# Patient Record
Sex: Female | Born: 1950 | Hispanic: No | Smoking: Never smoker
Health system: Southern US, Community
[De-identification: ages and names within clinical notes are randomized; demographics above are authoritative.]

## PROBLEM LIST (undated history)

## (undated) DIAGNOSIS — T7840XA Allergy, unspecified, initial encounter: Secondary | ICD-10-CM

## (undated) DIAGNOSIS — F419 Anxiety disorder, unspecified: Secondary | ICD-10-CM

## (undated) DIAGNOSIS — D649 Anemia, unspecified: Secondary | ICD-10-CM

## (undated) DIAGNOSIS — N809 Endometriosis, unspecified: Secondary | ICD-10-CM

## (undated) HISTORY — DX: Anemia, unspecified: D64.9

## (undated) HISTORY — PX: LAPAROSCOPIC ABDOMINAL EXPLORATION: SHX6249

## (undated) HISTORY — PX: WISDOM TOOTH EXTRACTION: SHX21

## (undated) HISTORY — PX: TUBAL LIGATION: SHX77

## (undated) HISTORY — DX: Anxiety disorder, unspecified: F41.9

## (undated) HISTORY — DX: Allergy, unspecified, initial encounter: T78.40XA

---

## 2015-07-24 ENCOUNTER — Emergency Department (HOSPITAL_COMMUNITY)
Admission: EM | Admit: 2015-07-24 | Discharge: 2015-07-24 | Disposition: A | Discharge status: Home / Self Care | Payer: No Typology Code available for payment source | Attending: Emergency Medicine | Admitting: Emergency Medicine

## 2015-07-24 ENCOUNTER — Encounter (HOSPITAL_COMMUNITY): Payer: Self-pay | Admitting: *Deleted

## 2015-07-24 ENCOUNTER — Emergency Department (HOSPITAL_COMMUNITY): Payer: No Typology Code available for payment source

## 2015-07-24 DIAGNOSIS — S138XXA Sprain of joints and ligaments of other parts of neck, initial encounter: Secondary | ICD-10-CM | POA: Diagnosis not present

## 2015-07-24 DIAGNOSIS — S134XXA Sprain of ligaments of cervical spine, initial encounter: Secondary | ICD-10-CM | POA: Diagnosis not present

## 2015-07-24 DIAGNOSIS — Y998 Other external cause status: Secondary | ICD-10-CM | POA: Diagnosis not present

## 2015-07-24 DIAGNOSIS — S139XXA Sprain of joints and ligaments of unspecified parts of neck, initial encounter: Secondary | ICD-10-CM

## 2015-07-24 DIAGNOSIS — S3993XA Unspecified injury of pelvis, initial encounter: Secondary | ICD-10-CM | POA: Diagnosis not present

## 2015-07-24 DIAGNOSIS — S301XXA Contusion of abdominal wall, initial encounter: Secondary | ICD-10-CM | POA: Insufficient documentation

## 2015-07-24 DIAGNOSIS — Y9389 Activity, other specified: Secondary | ICD-10-CM | POA: Insufficient documentation

## 2015-07-24 DIAGNOSIS — R072 Precordial pain: Secondary | ICD-10-CM | POA: Diagnosis not present

## 2015-07-24 DIAGNOSIS — S20211A Contusion of right front wall of thorax, initial encounter: Secondary | ICD-10-CM | POA: Insufficient documentation

## 2015-07-24 DIAGNOSIS — Y92410 Unspecified street and highway as the place of occurrence of the external cause: Secondary | ICD-10-CM | POA: Diagnosis not present

## 2015-07-24 DIAGNOSIS — M542 Cervicalgia: Secondary | ICD-10-CM | POA: Diagnosis not present

## 2015-07-24 DIAGNOSIS — R079 Chest pain, unspecified: Secondary | ICD-10-CM | POA: Diagnosis not present

## 2015-07-24 DIAGNOSIS — T148 Other injury of unspecified body region: Secondary | ICD-10-CM | POA: Diagnosis not present

## 2015-07-24 DIAGNOSIS — M545 Low back pain: Secondary | ICD-10-CM | POA: Diagnosis not present

## 2015-07-24 DIAGNOSIS — S299XXA Unspecified injury of thorax, initial encounter: Secondary | ICD-10-CM | POA: Diagnosis present

## 2015-07-24 MED ORDER — NAPROXEN 500 MG PO TABS: 500.0000 mg | ORAL_TABLET | Freq: Two times a day (BID) | ORAL | Status: DC

## 2015-07-24 MED ORDER — METHOCARBAMOL 500 MG PO TABS: 500.0000 mg | ORAL_TABLET | Freq: Two times a day (BID) | ORAL | Status: DC

## 2015-07-24 MED ORDER — HYDROCODONE-ACETAMINOPHEN 5-325 MG PO TABS: 1.0000 | ORAL_TABLET | Freq: Once | ORAL | Status: DC

## 2015-07-24 MED ORDER — HYDROCODONE-ACETAMINOPHEN 5-325 MG PO TABS: 1.0000 | ORAL_TABLET | Freq: Four times a day (QID) | ORAL | Status: DC | PRN
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
## 2015-07-24 NOTE — ED Notes (Signed)
[employer] at bedside 
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
## 2015-07-31 DIAGNOSIS — H2513 Age-related nuclear cataract, bilateral: Secondary | ICD-10-CM | POA: Diagnosis not present

## 2015-07-31 DIAGNOSIS — H35371 Puckering of macula, right eye: Secondary | ICD-10-CM | POA: Diagnosis not present

## 2015-11-16 ENCOUNTER — Ambulatory Visit (INDEPENDENT_AMBULATORY_CARE_PROVIDER_SITE_OTHER): Payer: Medicare Other | Admitting: Internal Medicine

## 2015-11-16 ENCOUNTER — Encounter: Payer: Self-pay | Admitting: Internal Medicine

## 2015-11-16 VITALS — BP 154/98 | HR 84 | Temp 98.0°F | Resp 18 | Ht 62.5 in | Wt 156.0 lb

## 2015-11-16 DIAGNOSIS — R6889 Other general symptoms and signs: Secondary | ICD-10-CM | POA: Diagnosis not present

## 2015-11-16 DIAGNOSIS — Z13220 Encounter for screening for lipoid disorders: Secondary | ICD-10-CM

## 2015-11-16 DIAGNOSIS — Z0001 Encounter for general adult medical examination with abnormal findings: Secondary | ICD-10-CM

## 2015-11-16 DIAGNOSIS — Z136 Encounter for screening for cardiovascular disorders: Secondary | ICD-10-CM | POA: Diagnosis not present

## 2015-11-16 DIAGNOSIS — I10 Essential (primary) hypertension: Secondary | ICD-10-CM

## 2015-11-16 DIAGNOSIS — Z0000 Encounter for general adult medical examination without abnormal findings: Secondary | ICD-10-CM

## 2015-11-16 DIAGNOSIS — M7582 Other shoulder lesions, left shoulder: Secondary | ICD-10-CM

## 2015-11-16 DIAGNOSIS — Z1329 Encounter for screening for other suspected endocrine disorder: Secondary | ICD-10-CM | POA: Diagnosis not present

## 2015-11-16 DIAGNOSIS — Z139 Encounter for screening, unspecified: Secondary | ICD-10-CM | POA: Diagnosis not present

## 2015-11-16 DIAGNOSIS — E559 Vitamin D deficiency, unspecified: Secondary | ICD-10-CM | POA: Diagnosis not present

## 2015-11-16 DIAGNOSIS — Z131 Encounter for screening for diabetes mellitus: Secondary | ICD-10-CM

## 2015-11-16 DIAGNOSIS — Z1389 Encounter for screening for other disorder: Secondary | ICD-10-CM

## 2015-11-16 LAB — HEPATIC FUNCTION PANEL
ALBUMIN: 4.3 g/dL (ref 3.6–5.1)
ALK PHOS: 73 U/L (ref 33–130)
ALT: 19 U/L (ref 6–29)
AST: 14 U/L (ref 10–35)
BILIRUBIN INDIRECT: 0.9 mg/dL (ref 0.2–1.2)
Bilirubin, Direct: 0.1 mg/dL (ref <=0.2)
TOTAL PROTEIN: 7.3 g/dL (ref 6.1–8.1)
Total Bilirubin: 1 mg/dL (ref 0.2–1.2)

## 2015-11-16 LAB — BASIC METABOLIC PANEL WITH GFR
BUN: 12 mg/dL (ref 7–25)
CO2: 27 mmol/L (ref 20–31)
Calcium: 9.7 mg/dL (ref 8.6–10.4)
Chloride: 101 mmol/L (ref 98–110)
Creat: 0.76 mg/dL (ref 0.50–0.99)
GFR, Est Non African American: 83 mL/min (ref >=60)
Glucose, Bld: 100 mg/dL — ABNORMAL HIGH (ref 65–99)
Potassium: 3.9 mmol/L (ref 3.5–5.3)
Sodium: 137 mmol/L (ref 135–146)

## 2015-11-16 LAB — CBC WITH DIFFERENTIAL/PLATELET
BASOS ABS: 0 {cells}/uL (ref 0–200)
Basophils Relative: 0 %
EOS ABS: 110 {cells}/uL (ref 15–500)
Eosinophils Relative: 1 %
HCT: 40.8 % (ref 35.0–45.0)
Hemoglobin: 13.6 g/dL (ref 11.7–15.5)
LYMPHS PCT: 33 %
Lymphs Abs: 3630 cells/uL (ref 850–3900)
MCH: 30.2 pg (ref 27.0–33.0)
MCHC: 33.3 g/dL (ref 32.0–36.0)
MCV: 90.5 fL (ref 80.0–100.0)
MONOS PCT: 6 %
MPV: 9.6 fL (ref 7.5–12.5)
Monocytes Absolute: 660 cells/uL (ref 200–950)
NEUTROS PCT: 60 %
Neutro Abs: 6600 cells/uL (ref 1500–7800)
PLATELETS: 355 10*3/uL (ref 140–400)
RBC: 4.51 MIL/uL (ref 3.80–5.10)
RDW: 13.8 % (ref 11.0–15.0)
WBC: 11 10*3/uL — ABNORMAL HIGH (ref 3.8–10.8)

## 2015-11-16 LAB — LIPID PANEL
Cholesterol: 242 mg/dL — ABNORMAL HIGH (ref 125–200)
HDL: 62 mg/dL (ref >=46)
LDL CALC: 139 mg/dL — AB (ref <130)
TRIGLYCERIDES: 204 mg/dL — AB (ref <150)
Total CHOL/HDL Ratio: 3.9 Ratio (ref <=5.0)
VLDL: 41 mg/dL — AB (ref <30)

## 2015-11-16 LAB — MAGNESIUM: Magnesium: 2 mg/dL (ref 1.5–2.5)

## 2015-11-16 LAB — TSH: TSH: 1.46 m[IU]/L

## 2015-11-16 LAB — HEMOGLOBIN A1C
HEMOGLOBIN A1C: 5.7 % — AB (ref <5.7)
Mean Plasma Glucose: 117 mg/dL

## 2015-11-16 NOTE — Patient Instructions (Signed)
Please take tumeric tablets twice daily with food.    Please pick two exercises daily. If you are doing well with this than you can go ahead and start adding in more exercises.  Rotator Cuff Tendinitis Rotator cuff tendinitis is inflammation of the tough, cord-like bands that connect muscle to bone (tendons) in your rotator cuff. Your rotator cuff is the collection of all the muscles and tendons that connect your arm to your shoulder. Your rotator cuff holds the head of your upper arm bone (humerus) in the cup (fossa) of your shoulder blade (scapula). CAUSES Rotator cuff tendinitis is usually caused by overusing the joint involved.  SIGNS AND SYMPTOMS  Deep ache in the shoulder also felt on the outside upper arm over the shoulder muscle.  Point tenderness over the area that is injured.  Pain comes on gradually and becomes worse with lifting the arm to the side (abduction) or turning it inward (internal rotation).  May lead to a chronic tear: When a rotator cuff tendon becomes inflamed, it runs the risk of losing its blood supply, causing some tendon fibers to die. This increases the risk that the tendon can fray and partially or completely tear. DIAGNOSIS Rotator cuff tendinitis is diagnosed by taking a medical history, performing a physical exam, and reviewing results of imaging exams. The medical history is useful to help determine the type of rotator cuff injury. The physical exam will include looking at the injured shoulder, feeling the injured area, and watching you do range-of-motion exercises. X-ray exams are typically done to rule out other causes of shoulder pain, such as fractures. MRI is the imaging exam usually used for significant shoulder injuries. Sometimes a dye study called CT arthrogram is done, but it is not as widely used as MRI. In some institutions, special ultrasound tests may also be used to aid in the diagnosis. TREATMENT  Less Severe Cases  Use of a sling to rest the  shoulder for a short period of time. Prolonged use of the sling can cause stiffness, weakness, and loss of motion of the shoulder joint.  Anti-inflammatory medicines, such as ibuprofen or naproxen sodium, may be prescribed. More Severe Cases  Physical therapy.  Use of steroid injections into the shoulder joint.  Surgery. HOME CARE INSTRUCTIONS   Use a sling or splint until the pain decreases. Prolonged use of the sling can cause stiffness, weakness, and loss of motion of the shoulder joint.  Apply ice to the injured area:  Put ice in a plastic bag.  Place a towel between your skin and the bag.  Leave the ice on for 20 minutes, 2-3 times a day.  Try to avoid use other than gentle range of motion while your shoulder is painful. Use the shoulder and exercise only as directed by your health care provider. Stop exercises or range of motion if pain or discomfort increases, unless directed otherwise by your health care provider.  Only take over-the-counter or prescription medicines for pain, discomfort, or fever as directed by your health care provider.  If you were given a shoulder sling and straps (immobilizer), do not remove it except as directed, or until you see a health care provider for a follow-up exam. If you need to remove it, move your arm as little as possible or as directed.  You may want to sleep on several pillows at night to lessen swelling and pain. SEEK IMMEDIATE MEDICAL CARE IF:   Your shoulder pain increases or new pain develops in your  arm, hand, or fingers and is not relieved with medicines.  You have new, unexplained symptoms, especially increased numbness in the hands or loss of strength.  You develop any worsening of the problems that brought you in for care.  Your arm, hand, or fingers are numb or tingling.  Your arm, hand, or fingers are swollen, painful, or turn white or blue. MAKE SURE YOU:  Understand these instructions.  Will watch your  condition.  Will get help right away if you are not doing well or get worse.   This information is not intended to replace advice given to you by your health care provider. Make sure you discuss any questions you have with your health care provider.   Document Released: 09/13/2003 Document Revised: 07/14/2014 Document Reviewed: 02/02/2013 Elsevier Interactive Patient Education [employer].

## 2015-11-16 NOTE — Progress Notes (Signed)
Annual Screening Comprehensive and Welcome to Medicare Examination   This very nice 65 y.o.female presents for new patient physical.  She reports that she moved here to help take care of her mother after she separated from her husband.    Patient reports that she has a family history or stroke and high blood pressure in her mother.  She is not aware of her father's family history.    She notes that she has some headache and tingling on the left side of her neck and head.  She notes that her ear got really sore.  She also notes that her ear was aching as well.  She does note that her left shoulder is really bothersome to her. She has some soreness when she is doing yoga at home.    She reports that she has been being treated by a natural doctor who was treating her for a "thyroid" condition. She reports that she was being treated with herbs and natural minerals.   She notes that she has gained about 20 lbs.  She notes that she had increased eating and also notes that she didn't feel like she has had a life and she was irritated with her mother who she was taking care of.  She has gotten back to cooking her more usual foods.  She notes that she wasn't eating right and used food for comfort.    Finally, patient has history of Vitamin D Deficiency and last vitamin D was No results found for: VD25OH.  Currently on supplementation  She does note that she is not interested in getting colonoscopy or mammograms.  She is also not interested in getting vaccines.    She does see Dr. Gloriann Loan as her dentist.  She goes twice yearly.    She sees Dr. Katy Fitch and went for her annual eye exam in February.    There is no urinary leakage.    There is no history of diabetes.     No current outpatient prescriptions on file prior to visit.   No current facility-administered medications on file prior to visit.    Allergies  Allergen Reactions  . Benadryl [Diphenhydramine] Other (See Comments)    Makes her want  to walk on the walls  . Peanuts [Peanut Oil] Swelling    No past medical history on file.   There is no immunization history on file for this patient.  No past surgical history on file.  Family History  Problem Relation Age of Onset  . Hypertension Mother   . Stroke Mother     Social History   Social History  . Marital Status: Married    Spouse Name: N/A  . Number of Children: N/A  . Years of Education: N/A   Occupational History  . Not on file.   Social History Main Topics  . Smoking status: Never Smoker   . Smokeless tobacco: Not on file  . Alcohol Use: No  . Drug Use: Not on file  . Sexual Activity: Not on file   Other Topics Concern  . Not on file   Social History Narrative    Review of Systems  Constitutional: Negative for fever, chills and malaise/fatigue.  HENT: Negative for congestion, ear pain and sore throat.   Respiratory: Negative for cough, shortness of breath and wheezing.   Cardiovascular: Negative for chest pain, palpitations and leg swelling.  Gastrointestinal: Positive for heartburn. Negative for abdominal pain, diarrhea, constipation, blood in stool and melena.  Genitourinary: Negative.  Musculoskeletal: Positive for joint pain.  Skin: Negative.   Neurological: Positive for sensory change. Negative for dizziness, loss of consciousness and headaches.  Psychiatric/Behavioral: Negative for depression. The patient is not nervous/anxious and does not have insomnia.       Physical Exam  BP 154/98 mmHg  Pulse 84  Temp(Src) 98 F (36.7 C) (Temporal)  Resp 18  Ht 5' 2.5" (1.588 m)  Wt 156 lb (70.761 kg)  BMI 28.06 kg/m2  General Appearance: Well nourished and in no apparent distress. Eyes: PERRLA, EOMs, conjunctiva no swelling or erythema, normal fundi and vessels. Sinuses: No frontal/maxillary tenderness ENT/Mouth: EACs patent / TMs  nl. Nares clear without erythema, swelling, mucoid exudates. Oral hygiene is good. No erythema,  swelling, or exudate. Tongue normal, non-obstructing. Tonsils not swollen or erythematous. Hearing normal. There is a cyst to the left side of the lateral neck. Neck: Supple, thyroid normal. No bruits, nodes or JVD. Respiratory: Respiratory effort normal.  BS equal and clear bilateral without rales, rhonci, wheezing or stridor. Cardio: Heart sounds are normal with regular rate and rhythm and no murmurs, rubs or gallops. Peripheral pulses are normal and equal bilaterally without edema. No aortic or femoral bruits. Chest: symmetric with normal excursions and percussion. Breasts: Symmetric, without lumps, nipple discharge, retractions, or fibrocystic changes.  Abdomen: Flat, soft, with bowl sounds. Nontender, no guarding, rebound, hernias, masses, or organomegaly.  Lymphatics: Non tender without lymphadenopathy.  Musculoskeletal: Full ROM all peripheral extremities, joint stability, 5/5 strength, and normal gait. Skin: Warm and dry without rashes, lesions, cyanosis, clubbing or  ecchymosis.  Neuro: Cranial nerves intact, reflexes equal bilaterally. Normal muscle tone, no cerebellar symptoms. Sensation intact.  Pysch: Awake and oriented X 3, normal affect, Insight and Judgment appropriate.   Assessment and Plan    1. Routine general medical examination at a health care facility  - CBC with Differential/Platelet - BASIC METABOLIC PANEL WITH GFR - Hepatic function panel - Magnesium  2. Screening for diabetes mellitus  - Hemoglobin A1c - Insulin, random  3. Screening for hyperlipidemia  - Lipid panel  4. Screening for thyroid disorder  - TSH  5. Screening for cardiovascular condition  - EKG 12-Lead - [place], RETROPERITNL ABD,  LTD  6. Screening for hematuria or proteinuria  - Urinalysis, Routine w reflex microscopic (not at [facility]) - Microalbumin / creatinine urine ratio  7. Rotator cuff tendinitis, left -tumeric BID -theraband exercises.  -if no improvement patient to contact the  office.    8. Vitamin D deficiency  - VITAMIN D 25 Hydroxy (Vit-D Deficiency, Fractures)  Continue prudent diet as discussed, weight control, regular exercise, and medications. Routine screening labs and tests as requested with regular follow-up as recommended.  Over 40 minutes of exam, counseling, chart review and critical decision making was performed

## 2015-11-17 LAB — URINALYSIS, ROUTINE W REFLEX MICROSCOPIC
BILIRUBIN URINE: NEGATIVE
GLUCOSE, UA: NEGATIVE
KETONES UR: NEGATIVE
Leukocytes, UA: NEGATIVE
Nitrite: NEGATIVE
Protein, ur: NEGATIVE
SPECIFIC GRAVITY, URINE: 1.015 (ref 1.001–1.035)
pH: 6 (ref 5.0–8.0)

## 2015-11-17 LAB — URINALYSIS, MICROSCOPIC ONLY
BACTERIA UA: NONE SEEN [HPF]
CASTS: NONE SEEN [LPF]
CRYSTALS: NONE SEEN [HPF]
WBC, UA: NONE SEEN WBC/HPF (ref <=5)
YEAST: NONE SEEN [HPF]

## 2015-11-17 LAB — MICROALBUMIN / CREATININE URINE RATIO
Creatinine, Urine: 82 mg/dL (ref 20–320)
Microalb Creat Ratio: 5 mcg/mg creat (ref <30)
Microalb, Ur: 0.4 mg/dL

## 2015-11-17 LAB — VITAMIN D 25 HYDROXY (VIT D DEFICIENCY, FRACTURES): VIT D 25 HYDROXY: 27 ng/mL — AB (ref 30–100)

## 2015-11-17 LAB — INSULIN, RANDOM: INSULIN: 6.4 u[IU]/mL (ref 2.0–19.6)

## 2016-02-22 ENCOUNTER — Ambulatory Visit: Payer: Self-pay | Admitting: Internal Medicine

## 2016-02-25 ENCOUNTER — Ambulatory Visit (INDEPENDENT_AMBULATORY_CARE_PROVIDER_SITE_OTHER): Payer: Medicare Other | Admitting: Internal Medicine

## 2016-02-25 VITALS — BP 118/76 | HR 96 | Temp 97.8°F | Resp 18 | Ht 62.5 in | Wt 150.0 lb

## 2016-02-25 DIAGNOSIS — R599 Enlarged lymph nodes, unspecified: Secondary | ICD-10-CM | POA: Diagnosis not present

## 2016-02-25 DIAGNOSIS — R51 Headache: Secondary | ICD-10-CM | POA: Diagnosis not present

## 2016-02-25 DIAGNOSIS — R519 Headache, unspecified: Secondary | ICD-10-CM

## 2016-02-25 DIAGNOSIS — R591 Generalized enlarged lymph nodes: Secondary | ICD-10-CM

## 2016-02-25 LAB — C-REACTIVE PROTEIN: CRP: <0.5 mg/dL (ref <0.60)

## 2016-02-25 MED ORDER — PREDNISONE 20 MG PO TABS: ORAL_TABLET | ORAL | 0 refills | Status: DC

## 2016-02-25 NOTE — Patient Instructions (Signed)
Trigeminal Neuralgia Trigeminal neuralgia is a nerve disorder that causes attacks of severe facial pain. The attacks last from a few seconds to several minutes. They can happen for days, weeks, or months and then go away for months or years. Trigeminal neuralgia is also called tic douloureux. CAUSES This condition is caused by damage to a nerve in the face that is called the trigeminal nerve. An attack can be triggered by:  Talking.  Chewing.  Putting on makeup.  Washing your face.  Shaving your face.  Brushing your teeth.  Touching your face. RISK FACTORS This condition is more likely to develop in:  Women.  People who are 35 years of age or older. SYMPTOMS The main symptom of this condition is pain in the jaw, lips, eyes, nose, scalp, forehead, and face. The pain may be intense, stabbing, electric, or shock-like. DIAGNOSIS This condition is diagnosed with a physical exam. A CT scan or MRI may be done to rule out other conditions that can cause facial pain. TREATMENT This condition may be treated with:  Avoiding the things that trigger your attacks.  Pain medicine.  Surgery. This may be done in severe cases if other medical treatment does not provide relief. HOME CARE INSTRUCTIONS  Take over-the-counter and prescription medicines only as told by your health care provider.  If you wish to get pregnant, talk with your health care provider before you start trying to get pregnant.  Avoid the things that trigger your attacks. It may help to:  Chew on the unaffected side of your mouth.  Avoid touching your face.  Avoid blasts of hot or cold air. SEEK MEDICAL CARE IF:  Your pain medicine is not helping.  You develop new, unexplained symptoms, such as:  Double vision.  Facial weakness.  Changes in hearing or balance.  You become pregnant. SEEK IMMEDIATE MEDICAL CARE IF:  Your pain is unbearable, and your pain medicine does not help.   This information is not  intended to replace advice given to you by your health care provider. Make sure you discuss any questions you have with your health care provider.   Document Released: 06/20/2000 Document Revised: 03/14/2015 Document Reviewed: 10/16/2014 Elsevier Interactive Patient Education 2016 Elsevier Inc.  Salivary Stone A salivary stone is a mineral deposit that builds up in the ducts that drain your salivary glands. Most salivary gland stones are made of calcium. When a stone forms, saliva can back up into the gland and cause painful swelling. Your salivary glands are the glands that produce spit (saliva). You have six major salivary glands. Each gland has a duct that carries saliva into your mouth. Saliva keeps your mouth moist and breaks down the food that you eat. It also helps to prevent tooth decay. Two salivary glands are located just in front of your ears (parotid). The ducts for these glands open up inside your cheeks, near your back teeth. You also have two glands under your tongue (sublingual) and two glands under your jaw (submandibular). The ducts for these glands open under your tongue. A stone can form in any salivary gland. The most common place for a salivary stone to develop is in a submandibular salivary gland. CAUSES Any condition that reduces the flow of saliva may lead to stone formation. It is not known why some people form stones and others do not.  RISK FACTORS You may be more likely to develop a salivary stone if you:  Are female.  Do not drink enough water.  Smoke.  Have high blood pressure.  Have gout.  Have diabetes. SIGNS AND SYMPTOMS The main sign of a salivary gland stone is sudden swelling of a salivary gland when eating. This usually happens under the jaw on one side. Other signs and symptoms include:  Swelling of the cheek or under the tongue when eating.  Pain in the swollen area.  Trouble chewing or swallowing.  Swelling that goes down after  eating. DIAGNOSIS Your health care provider may diagnose a salivary gland stone based on your signs and symptoms. The health care provider will also do a physical exam. In many cases, a stone can be felt in a duct inside your mouth. You may need to see an ear, nose, and throat specialist (ENT or otolaryngologist) for diagnosis and treatment. You may also need to have diagnostic tests. These may include imaging studies to check for a stone, such as:  X-rays.  Ultrasound.  CT scan.  MRI. TREATMENT Home care may be enough to treat a small stone that is not causing symptoms. Treatment of a stone that is large enough to cause symptoms may include:  Probing and widening the duct to allow the stone to pass.  Inserting a thin, flexible scope (endoscope) into the duct to locate and remove the stone.  Breaking up the stone with sound waves.  Removing the entire salivary gland. HOME CARE INSTRUCTIONS  Drink enough fluid to keep your urine clear or pale yellow.  Follow these instructions every few hours:  Suck on a lemon candy to stimulate the flow of saliva.  Put a hot compress over the gland.  Gently massage the gland.  Do not use any tobacco products, including cigarettes, chewing tobacco, or electronic cigarettes. If you need help quitting, ask your health care provider. SEEK MEDICAL CARE IF:  You have pain and swelling in your face, jaw, or mouth after eating.  You have persistent swelling in any of these places:  In front of your ear.  Under your jaw.  Inside your mouth. SEEK IMMEDIATE MEDICAL CARE IF:  You have pain and swelling in your face, jaw, or mouth that are getting worse.  Your pain and swelling make it hard to swallow or breathe.   This information is not intended to replace advice given to you by your health care provider. Make sure you discuss any questions you have with your health care provider.   Document Released: 07/31/2004 Document Revised: 07/14/2014  Document Reviewed: 11/23/2013 Elsevier Interactive Patient Education [employer].

## 2016-02-25 NOTE — Progress Notes (Signed)
Subjective:    Patient ID: Kelli Murphy, female    DOB: October 06, 1950, 65 y.o.   MRN: [id number]  HPI Patient presents to the office for evaluation of left sided facial pain which has been going on for since May of this year.  She reports that originally it started intermittently and then over the last month it has been getting signficantly worse.  She reports that she at first she though it was because she had hit her had on the wall while cleaning her closet.  She reports that there was a little bit of swelling. She feels like it is worse with cold air.  She does not have any difficulties swallowing.  She does feel like she is getting a lot more saliva in her mouth.  She reports that she did google it.  She reports that it does hurt to brush her face with a make up brush.  She did try to use warm compresses and that didn't help.  She also has been doing orange juice.  She reports also drinking more water.     Review of Systems  Constitutional: Negative for chills, fatigue and fever.  HENT: Positive for facial swelling. Negative for congestion, drooling, ear discharge, ear pain, mouth sores, postnasal drip, sinus pressure, sore throat, trouble swallowing and voice change.   Respiratory: Negative for chest tightness and shortness of breath.   Gastrointestinal: Negative for abdominal pain, blood in stool, constipation, diarrhea, nausea and vomiting.  Neurological: Positive for numbness. Negative for dizziness, facial asymmetry, speech difficulty, weakness and light-headedness.       Objective:   Physical Exam  Constitutional: She is oriented to person, place, and time. She appears well-developed and well-nourished. No distress.  HENT:  Head: Normocephalic and atraumatic.  Right Ear: Tympanic membrane normal.  Left Ear: Tympanic membrane normal.  Nose: Nose normal. Right sinus exhibits no maxillary sinus tenderness and no frontal sinus tenderness. Left sinus exhibits no maxillary sinus  tenderness and no frontal sinus tenderness.  Mouth/Throat: Uvula is midline, oropharynx is clear and moist and mucous membranes are normal. No trismus in the jaw. No dental abscesses or dental caries.  Eyes: Conjunctivae are normal.  Neck: Normal range of motion. Neck supple. No thyromegaly present.    Cardiovascular: Normal rate, regular rhythm, normal heart sounds and intact distal pulses.  Exam reveals no gallop and no friction rub.   No murmur heard. Pulmonary/Chest: Effort normal and breath sounds normal. No respiratory distress. She has no wheezes. She has no rales. She exhibits no tenderness.  Musculoskeletal: Normal range of motion.  Lymphadenopathy:    She has cervical adenopathy.  Neurological: She is alert and oriented to person, place, and time.  Skin: Skin is warm and dry. She is not diaphoretic.  Psychiatric: She has a normal mood and affect. Her behavior is normal. Judgment and thought content normal.  Nursing note and vitals reviewed.  Vitals:   02/25/16 1509  BP: 118/76  Pulse: 96  Resp: 18  Temp: 97.8 F (36.6 C)          Assessment & Plan:    1. Lymphadenopathy of head and neck -if improvement consider CT scan of maxillofacial and neck - predniSONE (DELTASONE) 20 MG tablet; 3 tabs po daily x 3 days, then 2 tabs x 3 days, then 1.5 tabs x 3 days, then 1 tab x 3 days, then 0.5 tabs x 3 days  Dispense: 27 tablet; Refill: 0  2. Left facial pain -rule out temporal  arteritis - C-reactive protein - Sed Rate (ESR)

## 2016-02-26 LAB — SEDIMENTATION RATE: SED RATE: 12 mm/h (ref 0–30)

## 2016-03-27 ENCOUNTER — Ambulatory Visit (INDEPENDENT_AMBULATORY_CARE_PROVIDER_SITE_OTHER): Payer: Medicare Other | Admitting: Internal Medicine

## 2016-03-27 ENCOUNTER — Encounter: Payer: Self-pay | Admitting: Internal Medicine

## 2016-03-27 VITALS — BP 124/82 | HR 88 | Temp 98.6°F | Resp 16 | Ht 62.5 in | Wt 149.0 lb

## 2016-03-27 DIAGNOSIS — R221 Localized swelling, mass and lump, neck: Secondary | ICD-10-CM

## 2016-03-27 DIAGNOSIS — R220 Localized swelling, mass and lump, head: Secondary | ICD-10-CM | POA: Diagnosis not present

## 2016-03-27 MED ORDER — PREGABALIN 50 MG PO CAPS: 50.0000 mg | ORAL_CAPSULE | Freq: Two times a day (BID) | ORAL | 0 refills | Status: DC

## 2016-03-27 NOTE — Progress Notes (Signed)
   Subjective:    Patient ID: Kelli Murphy, female    DOB: 01/06/1951, 65 y.o.   MRN: [id number]  HPI  Patient presents to the offfice for evaluation of left jaw pain again.  She was here for a visit last month for the same.  She did have some relief from taking a course of prednisone, but slowly it has been getting worse again. She reports that in the last few days it has been getting significantly worse.  She reports that she has also been taking an over the counter vitamin which is a collagen supplement.  She reports that she felt like that might have helped.  She reports that she is still really uncomfortable.  She reports that she has been doing some gentle massage and has also tried to do warm compresses.  She reports that she is still having a lot pain.    Review of Systems  Constitutional: Negative for chills, fatigue and fever.  HENT: Positive for facial swelling and sore throat. Negative for congestion, dental problem, mouth sores, nosebleeds, postnasal drip, sinus pressure and trouble swallowing.   Respiratory: Negative for chest tightness and shortness of breath.   Cardiovascular: Negative for chest pain and palpitations.       Objective:   Physical Exam  Constitutional: She appears well-developed and well-nourished. No distress.  HENT:  Head: Normocephalic.  Mouth/Throat: Oropharynx is clear and moist. No oropharyngeal exudate.  Mild left sided preauricular tenderness to palpation and also some left submandibular tenderness without obvious swelling or palpable masses.    Eyes: Conjunctivae are normal. No scleral icterus.  Neck: Normal range of motion. Neck supple. No JVD present. No thyromegaly present.  Cardiovascular: Normal rate, regular rhythm, normal heart sounds and intact distal pulses.  Exam reveals no gallop and no friction rub.   No murmur heard. Pulmonary/Chest: Effort normal and breath sounds normal. No respiratory distress. She has no wheezes. She has no rales.  She exhibits no tenderness.  Lymphadenopathy:    She has no cervical adenopathy.  Skin: She is not diaphoretic.  Nursing note and vitals reviewed.   Vitals:   03/27/16 0927  BP: 124/82  Pulse: 88  Resp: 16  Temp: 98.6 F (37 C)          Assessment & Plan:    1. Submandibular swelling -possible siloadenitis vs. Parotitis vs facial neuralgia - CT MAXILLOFACIAL W CONTRAST; Future - pregabalin (LYRICA) 50 MG capsule; Take 1 capsule (50 mg total) by mouth 2 (two) times daily.  Dispense: 30 capsule; Refill: 0

## 2016-04-03 ENCOUNTER — Ambulatory Visit
Admission: RE | Admit: 2016-04-03 | Discharge: 2016-04-03 | Disposition: A | Discharge status: Home / Self Care | Payer: Medicare Other | Source: Ambulatory Visit | Attending: Internal Medicine | Admitting: Internal Medicine

## 2016-04-03 DIAGNOSIS — R220 Localized swelling, mass and lump, head: Secondary | ICD-10-CM | POA: Diagnosis not present

## 2016-04-03 DIAGNOSIS — R221 Localized swelling, mass and lump, neck: Principal | ICD-10-CM

## 2016-04-03 MED ORDER — IOPAMIDOL (ISOVUE-300) INJECTION 61%
75.0000 mL | Freq: Once | INTRAVENOUS | Status: AC | PRN
  Administered 2016-04-03: 75 mL via INTRAVENOUS

## 2016-04-07 ENCOUNTER — Other Ambulatory Visit: Payer: Self-pay | Admitting: Internal Medicine

## 2016-04-07 MED ORDER — MELOXICAM 15 MG PO TABS: 15.0000 mg | ORAL_TABLET | Freq: Every day | ORAL | 3 refills | Status: DC

## 2016-05-08 ENCOUNTER — Ambulatory Visit (INDEPENDENT_AMBULATORY_CARE_PROVIDER_SITE_OTHER): Payer: Medicare Other | Admitting: Internal Medicine

## 2016-05-08 VITALS — BP 128/88 | HR 78 | Temp 98.0°F | Resp 18 | Ht 62.5 in | Wt 155.0 lb

## 2016-05-08 DIAGNOSIS — K1379 Other lesions of oral mucosa: Secondary | ICD-10-CM | POA: Diagnosis not present

## 2016-05-08 NOTE — Progress Notes (Signed)
   Subjective:    Patient ID: Kelli Murphy, female    DOB: May 29, 1951, 65 y.o.   MRN: [id number]  HPI  Patient reports to the office for possible sore in her mouth.  She reports that she has noted that this has been going on for the last 3 days.  She is afraid that this will get infected.  She reports that she is rinsing out her mouth with salt water and also listerine.  She reports that she has been using a natural supplement over the counter.  She reports that she has been using that too.  She is not having any swelling in her mouth, drainage, or discharge.     Review of Systems  Constitutional: Negative for chills, fatigue and fever.  HENT: Positive for mouth sores. Negative for congestion, dental problem, drooling, facial swelling, postnasal drip, sneezing, tinnitus and trouble swallowing.        Objective:   Physical Exam  Constitutional: She appears well-developed and well-nourished. No distress.  HENT:  Head: Normocephalic.  Mouth/Throat: Uvula is midline and mucous membranes are normal. No oral lesions. No trismus in the jaw. No oropharyngeal exudate, posterior oropharyngeal edema, posterior oropharyngeal erythema or tonsillar abscesses.  No noted mouth lesions  Eyes: Conjunctivae are normal. No scleral icterus.  Neck: Normal range of motion. Neck supple. No JVD present. No thyromegaly present.  Cardiovascular: Normal rate, regular rhythm, normal heart sounds and intact distal pulses.  Exam reveals no gallop and no friction rub.   No murmur heard. Pulmonary/Chest: Effort normal and breath sounds normal. No respiratory distress. She has no wheezes. She has no rales. She exhibits no tenderness.  Lymphadenopathy:    She has no cervical adenopathy.  Skin: She is not diaphoretic.  Nursing note and vitals reviewed.   Vitals:   05/08/16 0940  BP: 128/88  Pulse: 78  Resp: 18  Temp: 98 F (36.7 C)         Assessment & Plan:    1. Mouth sore -not visible on exam  today -cont salt water gargles or peroxide gargles -reassured patient today at visit

## 2016-05-08 NOTE — Patient Instructions (Signed)
Please start using salt water rinse or hydrogen peroxide mixed with water 4-5 times per day.  Please call the office if you get any type of swelling or discharge in your mouth.  You can continue to use listerene or the other mouthwash you buy at the supplement store.  Please do not use tea tree oil.

## 2016-05-10 ENCOUNTER — Encounter: Payer: Self-pay | Admitting: Internal Medicine

## 2016-05-16 DIAGNOSIS — H35371 Puckering of macula, right eye: Secondary | ICD-10-CM | POA: Diagnosis not present

## 2016-05-16 DIAGNOSIS — H2513 Age-related nuclear cataract, bilateral: Secondary | ICD-10-CM | POA: Diagnosis not present

## 2016-07-23 ENCOUNTER — Other Ambulatory Visit: Payer: Self-pay | Admitting: Internal Medicine

## 2016-08-11 ENCOUNTER — Encounter: Payer: Self-pay | Admitting: Internal Medicine

## 2016-08-11 ENCOUNTER — Ambulatory Visit (INDEPENDENT_AMBULATORY_CARE_PROVIDER_SITE_OTHER): Payer: Medicare Other | Admitting: Internal Medicine

## 2016-08-11 VITALS — BP 124/86 | HR 88 | Temp 97.3°F | Resp 16 | Ht 62.25 in | Wt 147.2 lb

## 2016-08-11 DIAGNOSIS — M26623 Arthralgia of bilateral temporomandibular joint: Secondary | ICD-10-CM | POA: Diagnosis not present

## 2016-08-11 DIAGNOSIS — F419 Anxiety disorder, unspecified: Secondary | ICD-10-CM

## 2016-08-11 DIAGNOSIS — F320 Major depressive disorder, single episode, mild: Secondary | ICD-10-CM | POA: Diagnosis not present

## 2016-08-11 MED ORDER — SERTRALINE HCL 50 MG PO TABS: ORAL_TABLET | ORAL | 2 refills | Status: AC

## 2016-08-11 NOTE — Patient Instructions (Signed)
Temporomandibular Joint Syndrome Temporomandibular joint (TMJ) syndrome is a condition that affects the joints between your jaw and your skull. The TMJs are located near your ears and allow your jaw to open and close. These joints and the nearby muscles are involved in all movements of the jaw. People with TMJ syndrome have pain in the area of these joints and muscles. Chewing, biting, or other movements of the jaw can be difficult or painful. TMJ syndrome can be caused by various things. In many cases, the condition is mild and goes away within a few weeks. For some people, the condition can become a long-term problem. What are the causes? Possible causes of TMJ syndrome include:  Grinding your teeth or clenching your jaw. Some people do this when they are under stress.  Arthritis.  Injury to the jaw.  Head or neck injury.  Teeth or dentures that are not aligned well. In some cases, the cause of TMJ syndrome may not be known. What are the signs or symptoms? The most common symptom is an aching pain on the side of the head in the area of the TMJ. Other symptoms may include:  Pain when moving your jaw, such as when chewing or biting.  Being unable to open your jaw all the way.  Making a clicking sound when you open your mouth.  Headache.  Earache.  Neck or shoulder pain. How is this diagnosed? Diagnosis can usually be made based on your symptoms, your medical history, and a physical exam. Your health care provider may check the range of motion of your jaw. Imaging tests, such as X-rays or an MRI, are sometimes done. You may need to see your dentist to determine if your teeth and jaw are lined up correctly. How is this treated? TMJ syndrome often goes away on its own. If treatment is needed, the options may include:  Eating soft foods and applying ice or heat.  Medicines to relieve pain or inflammation.  Medicines to relax the muscles.  A splint, bite plate, or mouthpiece to  prevent teeth grinding or jaw clenching.  Relaxation techniques or counseling to help reduce stress.  Transcutaneous electrical nerve stimulation (TENS). This helps to relieve pain by applying an electrical current through the skin.  Acupuncture. This is sometimes helpful to relieve pain.  Jaw surgery. This is rarely needed. Follow these instructions at home:  Take medicines only as directed by your health care provider.  Eat a soft diet if you are having trouble chewing.  Apply ice to the painful area.  Put ice in a plastic bag.  Place a towel between your skin and the bag.  Leave the ice on for 20 minutes, 2-3 times a day.  Apply a warm compress to the painful area as directed.  Massage your jaw area and perform any jaw stretching exercises as recommended by your health care provider.  If you were given a mouthpiece or bite plate, wear it as directed.  Avoid foods that require a lot of chewing. Do not chew gum.  Keep all follow-up visits as directed by your health care provider. This is important. Contact a health care provider if:  You are having trouble eating.  You have new or worsening symptoms. Get help right away if:  Your jaw locks open or closed. This information is not intended to replace advice given to you by your health care provider. Make sure you discuss any questions you have with your health care provider. Document Released: 03/18/2001 Document  Revised: 02/21/2016 Document Reviewed: 01/26/2014 Elsevier Interactive Patient Education  2017 Elsevier Inc.  

## 2016-08-11 NOTE — Progress Notes (Signed)
[facility]   Unk Pinto, M.D.    Uvaldo Bristle. Silverio Lay, P.A.-C      Starlyn Skeans, P.A.-C  [facility]                [address] [place], [place] [id number] Telephone [phone] Telefax [phone]  Subjective:    Patient ID: Kelli Murphy, female    DOB: 1950/08/22, 66 y.o.   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 She is using Meloxicam with improvement. She is tearful most of the visit enumerating her divorce from her bipolar ex-husband of 18 years. Denies any difficulty chewing or swallowing and recent saw her dentist and was told her he "saw nothing wrong". Denies any sinus congestion, drainage or pressure. She does endorse her feelings of depressed mod and denies and SI.   Medication Sig  . Cholecalciferol (VITAMIN D) 2000 units CAPS Take 4,000 Units by mouth daily.  Marland Kitchen Lysine 500 MG TABS Take by mouth daily as needed.  . meloxicam (MOBIC) 15 MG tablet TAKE 1 TABLET EVERY DAY  . vitamin A 25000 UNIT capsule Take 25,000 Units by mouth daily.   Allergies  Allergen Reactions  . Benadryl [Diphenhydramine] Other (See Comments)    Makes her want to walk on the walls  . Peanuts [Peanut Oil] Swelling   Past Medical History:  Diagnosis Date  . Allergy   . Anemia   . Anxiety   . Shingles    Review of Systems  10 point systems review negative except as above.    Objective:   Physical Exam  BP 124/86   Pulse 88   Temp 97.3 F (36.3 C)   Resp 16   Ht 5' 2.25" (1.581 m)   Wt 147 lb 3.2 oz (66.8 kg)   BMI 26.71 kg/m   HEENT - Eac's patent. TM's Nl, but TM jy tender bilaterally - Lt>Rt - EOM's full. PERRLA. NasoOroPharynx clear.Tongue appears normal.  Neck - supple. Nl Thyroid. Carotids 2+ & No bruits, nodes, JVD.  No abnormal masses palpitated Chest - Clear equal BS w/o Rales, rhonchi, wheezes. Cor - Nl HS. RRR w/o sig MGR. PP 1(+). No edema. MS- FROM w/o deformities. Muscle power, tone and bulk Nl. Gait Nl. Neuro - No obvious Cr N abnormalities. Sensory, motor and Cerebellar functions appear Nl w/o focal abnormalities. Psyche - Mental status- tearful. No hallucinations.    Assessment & Plan:   1. Bilateral temporomandibular joint pain   2. Anxiety tension state  - sertraline (ZOLOFT) 50 MG tablet; Take 1/2 to 1 tablet daily for mood & anxiety  Dispense: 30 tablet; Refill: 2  3. Mild single current episode of major depressive disorder (HCC)  - sertraline (ZOLOFT) 50 MG tablet; Take 1/2 to 1 tablet daily for mood & anxiety  Dispense: 30 tablet; Refill: 2  - Discussed meds/SE's and recc ROV 6 weeks. Discussed soft diet , particularly wrt DASH diet.

## 2016-08-14 ENCOUNTER — Telehealth: Payer: Self-pay | Admitting: *Deleted

## 2016-08-14 NOTE — Telephone Encounter (Signed)
Patient called and states she started the Sertraline 50 mg 1/2 tablet x 2 days and she is having blurred vision and feels strange.  Per Dr Melford Aase, stop the medication x 1 week and then restart at 1/4 tablet daily.  The patient is aware.

## 2016-09-22 ENCOUNTER — Ambulatory Visit: Payer: Self-pay | Admitting: Internal Medicine

## 2016-10-10 DIAGNOSIS — B370 Candidal stomatitis: Secondary | ICD-10-CM | POA: Diagnosis not present

## 2016-10-10 DIAGNOSIS — M26622 Arthralgia of left temporomandibular joint: Secondary | ICD-10-CM | POA: Diagnosis not present

## 2016-10-10 DIAGNOSIS — G500 Trigeminal neuralgia: Secondary | ICD-10-CM | POA: Diagnosis not present

## 2016-10-21 DIAGNOSIS — M26602 Left temporomandibular joint disorder, unspecified: Secondary | ICD-10-CM | POA: Diagnosis not present

## 2016-10-21 DIAGNOSIS — B370 Candidal stomatitis: Secondary | ICD-10-CM | POA: Diagnosis not present

## 2016-10-21 DIAGNOSIS — G500 Trigeminal neuralgia: Secondary | ICD-10-CM | POA: Diagnosis not present

## 2016-11-10 ENCOUNTER — Encounter: Payer: Self-pay | Admitting: Internal Medicine

## 2016-11-18 DIAGNOSIS — M26602 Left temporomandibular joint disorder, unspecified: Secondary | ICD-10-CM | POA: Diagnosis not present

## 2016-11-18 DIAGNOSIS — R1313 Dysphagia, pharyngeal phase: Secondary | ICD-10-CM | POA: Diagnosis not present

## 2016-11-26 DIAGNOSIS — R634 Abnormal weight loss: Secondary | ICD-10-CM | POA: Diagnosis not present

## 2016-11-26 DIAGNOSIS — R220 Localized swelling, mass and lump, head: Secondary | ICD-10-CM | POA: Diagnosis not present

## 2016-11-26 DIAGNOSIS — R252 Cramp and spasm: Secondary | ICD-10-CM | POA: Diagnosis not present

## 2016-11-27 ENCOUNTER — Encounter: Payer: Self-pay | Admitting: Radiation Oncology

## 2016-11-27 ENCOUNTER — Telehealth: Payer: Self-pay | Admitting: *Deleted

## 2016-11-27 NOTE — Telephone Encounter (Signed)
Oncology Nurse Navigator Documentation  Placed introductory call to new referral patient Kelli Murphy.  Introduced myself as the H&N oncology nurse navigator that works with Dr. Isidore Moos to whom she has been referred by ENT Monica Becton.   She confirmed her understanding of referral and appt date/time of 12/03/2016 8:30/9:00.  I briefly explained my role as her navigator, indicated that I would be joining her during her appt next week.  I confirmed her understanding of [facility] location, explained arrival and Rad[facility] registration process for appt.  I provided overview of treatment, including RT w/wo chemotherapy.  She understands she will be contacted by Dr. Calton Dach office to arrange an  appt.  Of note:  Had biopsy yesterday.  PET scan is being scheduled.  Has not had a CT Neck w/ contrast.  She understands I will call Meredith Leeds to request.  Stated she has partials, some of her own teeth.  I explained the purpose of a dental evaluation prior to starting RT, indicated she wd be contacted by [facility] to arrange an appt within a day or so of her appt with Dr. Isidore Moos. I provided my contact information, encouraged her to call with questions/concerns before next week. I provided my contact information, encouraged her to call with questions/concerns. She verbalized understanding of information provided, expressed appreciation for my call.  Gayleen Orem, RN, BSN, [facility] Nurse [place] [facility] [place] [phone]

## 2016-11-27 NOTE — Telephone Encounter (Signed)
Oncology Nurse Navigator Documentation  Spoke with Triage RN Juliann Pulse, ENT Dr. Meredith Leeds' office. Informed patient has 5/30 8:30 appt to see RadOnc physician Kelli Murphy. Requested orders for:  Initial PET of skull base to mid thigh  CT Neck with contrast She stated she will send fax with orders, I provided RadOnc fax number.  Gayleen Orem, RN, BSN, [facility] Nurse [place] [facility] [place] [phone]

## 2016-11-27 NOTE — Progress Notes (Signed)
Head and Neck Cancer Location of Tumor / Histology:  Anterior two thirds of tongue.   Patient presented  months ago with symptoms of: Dr. Meredith Leeds documents 11/26/16: Kelli Murphy presents today complaining that her tongue has been hurting and having some radiation of pain to the left ear. She is unable to open her mouth well. She was treated for yeast initially. She did not get much better. She was also diagnosed with trigeminal neuralgia. She came to see me because of her persistent mouth pain. She has lost about 39 pounds in 4 months.    Biopsies of Anterior two thirds of tongue revealed:  11/26/16   Nutrition Status Yes No Comments  Weight changes? [x]  []  39 lbs in about 4 months  Swallowing concerns? [x]  []  She is unable to swallow. She is not eating at all. She is drinking water, gingerale. She has tried ensure, but it made her feel badly. She does eat some pureed foods at times.  PEG? []  [x]     Referrals Yes No Comments  Social Work? []  [x]    Dentistry? []  [x]    Swallowing therapy? []  [x]    Nutrition? []  [x]    Med/Onc? []  [x]     Safety Issues Yes No Comments  Prior radiation? []  [x]    Pacemaker/ICD? []  [x]    Possible current pregnancy? []  [x]    Is the patient on methotrexate? []  [x]     Tobacco/Marijuana/Snuff/ETOH use: Never a smoker. Never used alcohol use.   Past/Anticipated interventions by otolaryngology, if any:  11/05/16 Dr. Meredith Leeds Procedure: Biopsy anterior two thirds of tongue Procedure Note: Flexible fiberoptic nasopharyngoscopy CPT 31575  Pre-operative Diagnosis: Tongue Mass  Post-operative Diagnosis: same Anesthesia: Lidocaine 4% and Afrin applied topically to the nasal cavity    Past/Anticipated interventions by medical oncology, if any:  N/A  Current Complaints / other details:   She has pain a 10/10 at times to the right side of her face. It comes on quickly and is a sharp pain. It also hurts to her Left jaw area at times. She rates the pain a constant 8/10. She  is asking for pain medicine today.  She is considering hospice treatment.   BP 108/81   Pulse 96   Temp 97.6 F (36.4 C) Comment: axillary  Ht 5' 2.25" (1.581 m)   Wt 112 lb 6.4 oz (51 kg)   SpO2 100% Comment: room air  BMI 20.39 kg/m    Wt Readings from Last 3 Encounters:  12/03/16 112 lb 6.4 oz (51 kg)  08/11/16 147 lb 3.2 oz (66.8 kg)  05/08/16 155 lb (70.3 kg)

## 2016-11-28 ENCOUNTER — Other Ambulatory Visit: Payer: Self-pay | Admitting: *Deleted

## 2016-11-28 ENCOUNTER — Telehealth: Payer: Self-pay | Admitting: *Deleted

## 2016-11-28 ENCOUNTER — Other Ambulatory Visit: Payer: Medicare Other

## 2016-11-28 DIAGNOSIS — C01 Malignant neoplasm of base of tongue: Secondary | ICD-10-CM

## 2016-11-28 NOTE — Telephone Encounter (Signed)
Oncology Nurse Navigator Documentation  Received call from Ms. Monty.  She stated "I'm too sick", needs to cancel this morning's 11:30 lab. I voiced understanding, indicated lab will be rescheduled for 5/29 8:00 prior to 9:30 PET.  She voiced understanding.  Gayleen Orem, RN, BSN, [place] [facility]ck Oncology Nurse [place] at [place] [phone]

## 2016-11-28 NOTE — Telephone Encounter (Signed)
Oncology Nurse Navigator Documentation  Spoke with Ms. Kelli Murphy 0930 this morning to check on her availability for PET, CT neck and BUN/Creatinine lab.   She denied conflicts, understands I will call her back with appt times. She requested cancellation of 5/30 appt with Dr. Isidore Moos citing overwhelming financial concerns r/t treatment costs as the reason.  "I can't pay anything.  I only have a little bit of savings and need to keep it.  I want to focus on quality of life." I explained there are several options re payments, encouraged her to speak with a Patent attorney.  She did not indicate a change of mind.  Spoke with Ms. Kelli Murphy 1030.  Informed PET scheduled for 5/29 0930 with 0900 arrival, CT Neck 1230.  Explained NPO status beginning midnight on 5/28 for PET, she voiced understanding.  I indicated lab can be scheduled today whenever she can arrange a ride.  She later called to say she can arrive around 1130.  I offered opportunity for her to speak with RadOnc Development worker, community after lab.  She voiced willingness depending on driver's availability.  I notified Meredith Hobgood.  Gayleen Orem, RN, BSN, [place] Neck Oncology Nurse [place] at [place] [phone]

## 2016-12-02 ENCOUNTER — Ambulatory Visit (HOSPITAL_COMMUNITY): Payer: Medicare Other

## 2016-12-02 ENCOUNTER — Telehealth: Payer: Self-pay | Admitting: *Deleted

## 2016-12-02 ENCOUNTER — Other Ambulatory Visit: Payer: Medicare Other

## 2016-12-02 ENCOUNTER — Ambulatory Visit (HOSPITAL_COMMUNITY): Admission: RE | Admit: 2016-12-02 | Payer: Medicare Other | Source: Ambulatory Visit

## 2016-12-02 DIAGNOSIS — C029 Malignant neoplasm of tongue, unspecified: Secondary | ICD-10-CM | POA: Diagnosis not present

## 2016-12-02 NOTE — Telephone Encounter (Signed)
Oncology Nurse Navigator Documentation  Received VMM from patient left [phone] this morning.  She stated she is not coming for lab, PET and CT Neck appts this morning (per my conversations with her last week), requested cancellation.  Also requested cancellation of tomorrow's appt with Dr. Isidore Moos.    She stated further she does not intend to have radiation for her cancer.   I LVMM [id number] acknowledging her intent not to have treatment, reiterated purpose of today's appts are diagnostic only, tomorrow's appt with Dr. Isidore Moos is for discussion purposes only.  I acknowledged her intent not to have treatment. I encouraged her to call me to confirm message receipt and to obtain further clarification if needed.  Gayleen Orem, RN, BSN, [place] Neck Oncology Nurse [place] at [place] [phone]

## 2016-12-02 NOTE — Telephone Encounter (Signed)
Oncology Nurse Navigator Documentation  [name] returned my VM.  She expressed understanding of the purpose of today's lab and imaging but is not interested in knowing extent of disease.  She wishes to enter hospice.  I explained Dr. Isidore Moos can facilitate hospice referral, help her with pain control pending hospice enrollment.  She voiced understanding, agreed to keep tomorrow's appt.    She expressed appreciation for my understanding and support over the past days. I provided update to Dr. Isidore Moos.  Kelli Orem, RN, BSN, [place] Neck Oncology Nurse [place] at [place] [phone]

## 2016-12-03 ENCOUNTER — Encounter: Payer: Self-pay | Admitting: Radiation Oncology

## 2016-12-03 ENCOUNTER — Encounter: Payer: Self-pay | Admitting: *Deleted

## 2016-12-03 ENCOUNTER — Ambulatory Visit
Admission: RE | Admit: 2016-12-03 | Discharge: 2016-12-03 | Disposition: A | Discharge status: Home / Self Care | Payer: Medicare Other | Source: Ambulatory Visit | Attending: Radiation Oncology | Admitting: Radiation Oncology

## 2016-12-03 VITALS — BP 108/81 | HR 96 | Temp 97.6°F | Ht 62.25 in | Wt 112.4 lb

## 2016-12-03 DIAGNOSIS — C023 Malignant neoplasm of anterior two-thirds of tongue, part unspecified: Secondary | ICD-10-CM | POA: Diagnosis not present

## 2016-12-03 DIAGNOSIS — C029 Malignant neoplasm of tongue, unspecified: Secondary | ICD-10-CM

## 2016-12-03 DIAGNOSIS — N809 Endometriosis, unspecified: Secondary | ICD-10-CM | POA: Insufficient documentation

## 2016-12-03 DIAGNOSIS — D649 Anemia, unspecified: Secondary | ICD-10-CM | POA: Diagnosis not present

## 2016-12-03 DIAGNOSIS — F419 Anxiety disorder, unspecified: Secondary | ICD-10-CM | POA: Diagnosis not present

## 2016-12-03 HISTORY — DX: Endometriosis, unspecified: N80.9

## 2016-12-03 MED ORDER — MORPHINE SULFATE (CONCENTRATE) 10 MG /0.5 ML PO SOLN
5.0000 mg | ORAL | 0 refills | Status: AC | PRN
Start: 2016-12-03 — End: ?

## 2016-12-03 NOTE — Progress Notes (Signed)
Oncology Nurse Navigator Documentation  Met with Kelli Murphy during initial consult with Dr. Isidore Moos.  She was accompanied by friend Kelli Murphy. She voiced desire to complete Advance Directives today if possible.  I later escorted her to [facility] and Patient Support where she met with Kelli Lana, LCSW. She voiced understanding of Dr. Pearlie Oyster discussion of diagnosis, likelihood of successful treatment with surgery. Kelli Murphy reiterated her desire to not have treatment, preference to enroll with hospice.  Dr. Isidore Moos supported her wish. Kelli Murphy reported ongoing severe pain with tongue, sides of face, sometimes radiating to scalp.  She reported chest tightness and SOB when she took Hycet prescribed by ENT Kelli Murphy. Dr. Isidore Moos provided Rx for liquid morphine as alternative pending medication adjustment under the guidance of hospice. Kelli Murphy expressed appreciation for my support.  Gayleen Orem, RN, BSN, [place] Neck Oncology Nurse [place] at [place] [phone]

## 2016-12-03 NOTE — Progress Notes (Signed)
Radiation Oncology         ([phone]) [phone] ________________________________  Initial outpatient Consultation  Name: Kelli Murphy MRN: [id number]  Date: 12/03/2016  DOB: Oct 13, 1950  [id number], [facility] [place], Jamal Collin., MD   REFERRING PHYSICIAN: Nicole Kindred., MD  DIAGNOSIS:    ICD-9-CM ICD-10-CM   1. 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  HISTORY OF PRESENT ILLNESS::Kelli Murphy is a 66 y.o. female who presented to her PCP on 08/11/16 for copious salivation, a white tongue, and bilateral temporomandibular joint pain. Physical exam at the time revealed a supple neck with no abnormal masses palpated. The DASH diet and one that was soft was recommended. Of note, the patient had a CT maxillofacial on 04/03/16 for left submandibular swelling and pain for a month. This showed minimal bilateral TMJ osteoarthritis and no focal abnormality of the visualized salivary glands.  The patient then presented to Dr. Meredith Leeds, ENT, on 11/26/16 for tongue pain that radiated to the left ear, unable to open her mouth well, dysphagia, voice change, poor taste, and a weight loss of 39 lbs in 4 months. Physical exam at the time showed a submucosal tongue mass by digital palpation that crossed the midline. It was very hard on palpation and restricted the mobility of her tongue and affecter her speech. Flexible fiberoptic exam showed normal appearance to the supraglottic structures and the larynx, the true vocal cords had normal mobility with no ulcerations or lesions, and the base of tongue appeared to be involved. Biopsy of the lateral edge of the tongue, approximately two-thirds towards the posterior  aspect, was performed. I received word from Dr. 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 No pertinent imaging has been performed.  The patient presents to the clinic with a female friend and Gayleen Orem, RN, our Head and Neck Oncology Navigator was present during the encounter. She initially intended to cancel this consult, then expressed a desire to see me for a hospice referral.    Swallowing issues, if any: The patient is unable to swallow foods and does eat pureed food at times. She is drinking water and ginger ale. She tried supplementing with Ensure, but reports it made her feel bad.   Weight Changes: 39 lbs in the last 4 months  Pain status: She reports pain as a 10/10 at times to the right side of her face and pain to her left jaw at times. She would have constant pain as a 8/10 and she would have sharp pain at times.  Other symptoms: The patient has been provided with Hycet for pain. She reports the pain medication burns when it is in her mouth and states she has difficulty breathing when she takes it. She has trismus and dysarthria. She has SOB when her pain is too much. She has diarrhea when she takes Boost/Ensure.  She expressed adamant refusal to consider any treatment whatsoever, even if cure is possible.  She refuses surgical referral to [facility] for discussion of possible resection.  Tobacco history, if any: None  ETOH abuse, if any: None  Prior cancers, if any: None  PREVIOUS RADIATION THERAPY: No  PAST MEDICAL HISTORY:  has a past medical history of Allergy; Anemia; Anxiety; and Endometriosis.    PAST SURGICAL HISTORY: Past Surgical History:  Procedure Laterality Date  . LAPAROSCOPIC ABDOMINAL EXPLORATION     endometriosis  . TUBAL LIGATION    . WISDOM TOOTH EXTRACTION      FAMILY HISTORY: family history includes Hypertension in her mother; Stroke in her  mother.  SOCIAL HISTORY:  reports that she has never smoked. She has never used smokeless tobacco. She reports that she does not drink alcohol or use drugs.  ALLERGIES: Benadryl [diphenhydramine] and Peanuts [peanut oil]  MEDICATIONS:  Current Outpatient Prescriptions  Medication Sig Dispense Refill  . Cholecalciferol (VITAMIN D) 2000 units CAPS Take 4,000 Units by mouth daily.    Marland Kitchen HYDROcodone-acetaminophen (HYCET) 7.5-325 mg/15 ml solution Take by mouth.    . Lysine 500 MG TABS Take by mouth daily as needed.    . meloxicam (MOBIC) 15 MG tablet TAKE 1 TABLET EVERY DAY (Patient not taking: Reported on 12/03/2016) 90 tablet 1  . Morphine Sulfate (MORPHINE CONCENTRATE) 10 mg / 0.5 ml concentrated solution Take 0.25-0.5 mLs (5-10 mg total) by mouth every 3 (three) hours as needed for severe pain. 473 mL 0  . prednisoLONE (PRELONE) 15 MG/5ML SOLN Take 1-1/2 teaspoons tonight and tomorrow morning. Take 1 teaspoon by mouth every morning for 4 days. Total of 6 days.    Marland Kitchen sertraline (ZOLOFT) 50 MG tablet Take 1/2 to 1 tablet daily for mood & anxiety 30 tablet 2  . vitamin A 25000 UNIT capsule Take 25,000 Units by mouth daily.     No current facility-administered medications for this encounter.     REVIEW OF SYSTEMS: A 10+ POINT REVIEW OF SYSTEMS WAS OBTAINED including neurology, dermatology, psychiatry, cardiac, respiratory, lymph, extremities, GI, GU, Musculoskeletal, constitutional,   HEENT.  All pertinent positives are noted in the HPI.  All others are negative.   PHYSICAL EXAM:  height is 5' 2.25" (1.581 m) and weight is 112 lb 6.4 oz (51 kg). Her temperature is 97.6 F (36.4 C). Her blood pressure is 108/81 and her pulse is 96. Her oxygen saturation is 100%.   General: Alert and oriented, in no acute distress HEENT: Head is normocephalic. Extraocular movements are intact. Oropharynx is notable for significant trismus, copious saliva, unable to see much due to the trismus, limited tongue  mobility. Her tongue is tender, firmness centrally, due to the tenderness and trismus I did not palpate the tongue thoroughly. Neck: Neck is notable for a mobile 1.5 cm superficial node at the angle of the left mandible the patient reports she had since the age of 74. Otherwise, no other palpable masses appreciated. Heart: Regular in rate and rhythm with no murmurs, rubs, or gallops. Chest: Clear to auscultation bilaterally, with no rhonchi, wheezes, or rales. Abdomen: Soft, nontender, nondistended, with no rigidity or guarding. Extremities: No cyanosis or edema. Lymphatics: see Neck Exam Skin: No concerning lesions. Musculoskeletal: symmetric strength and muscle tone throughout. Neurologic: Cranial nerves II through XII are grossly intact. No obvious focalities. Speech is notable for dysarthria. Coordination is intact. Psychiatric: Judgment and insight are intact. Affect is appropriate.   ECOG =2  0 - Asymptomatic (Fully active, able to carry on all predisease activities without restriction)  1 - Symptomatic but completely ambulatory (Restricted in physically strenuous activity but ambulatory and able to carry out work of a light or sedentary nature. For example, light housework, office work)  2 - Symptomatic, <50% in bed during the day (Ambulatory and capable of all self care but unable to carry out any work activities. Up and about more than 50% of waking hours)  3 - Symptomatic, >50% in bed, but not bedbound (Capable of only limited self-care, confined to bed or chair 50% or more of waking hours)  4 - Bedbound (Completely disabled. Cannot carry on any self-care. Totally confined to bed or chair)  5 - Death   Eustace Pen MM, Creech RH, Tormey DC, et al. [phone]). "Toxicity and response criteria of the [facility]". [place] Oncol. 5 (6): 649-55   LABORATORY DATA:  Lab Results  Component Value Date   WBC 11.0 (H) 11/16/2015   HGB 13.6 11/16/2015   HCT 40.8  11/16/2015   MCV 90.5 11/16/2015   PLT 355 11/16/2015   CMP     Component Value Date/Time   NA 137 11/16/2015 1211   K 3.9 11/16/2015 1211   CL 101 11/16/2015 1211   CO2 27 11/16/2015 1211   GLUCOSE 100 (H) 11/16/2015 1211   BUN 12 11/16/2015 1211   CREATININE 0.76 11/16/2015 1211   CALCIUM 9.7 11/16/2015 1211   PROT 7.3 11/16/2015 1211   ALBUMIN 4.3 11/16/2015 1211   AST 14 11/16/2015 1211   ALT 19 11/16/2015 1211   ALKPHOS 73 11/16/2015 1211   BILITOT 1.0 11/16/2015 1211   GFRNONAA 83 11/16/2015 1211   GFRAA >89 11/16/2015 1211         RADIOGRAPHY: No results found.    IMPRESSION/PLAN: I had a lengthy discussion with the patient today.  It is still not 100% clear what her pathology is - but this is potentially a low grade tumor, pathology undergoing further review. No staging scans have been done.  Standard of care would be to get an opinion about aggressive surgical resection upfront - Dr Meredith Leeds and I spoke, and he agreed referral to [facility] is appropriate.  Patient adamantly refuses this.  She appears able to make her own decisions, and appears to understand her situation with intact judgment and insight. She doesn't want any treatment, nor a feeding tube. She adamantly wants a hospice referral today, and a change in pain medication (Hycet caused adverse reaction).  In summary: This is a patient with head and neck cancer. Dr. 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 If the final opinion confirms this diagnosis, then radiation may not be a good option given that this would be a slow growing tumor and the effects of radiation is lessened given the type of tumor. I discussed a possible referral to surgery with the patient. However, the patient prefers quality of life care and does not intend to treat her condition. The patient is interested in Hospice and she  has declined labs, PET, and CT for further workup. Her only wish is pain control. I prescribed Morphine Sulfate to take every 3 hours as needed for oral pain. I respect her wish and I have made a referral to Hospice.   She and her friend expressed gratitude for our visit today. I wished her the best. __________________________________________   Eppie Gibson, MD  This document serves as a record of services personally performed by Eppie Gibson, MD. It was created on her behalf by Darcus Austin, a trained medical scribe. The creation of this record is based  on the scribe's personal observations and the provider's statements to them. This document has been checked and approved by the attending provider.

## 2016-12-03 NOTE — Progress Notes (Signed)
[place] Social Work  Clinical Social Work was referred by patient to review and complete healthcare advance directives.  Clinical Social Worker met with patient and patients friend in [place] office.  The patient designated Alberteen Sam as their primary healthcare agent and [place] Slider Ray as their secondary agent.  Patient also completed healthcare living will.    Clinical Social Worker notarized documents and made copies for patient/family. Clinical Social Worker will send documents to medical records to be scanned into patient's chart. Clinical Social Worker encouraged patient/family to contact with any additional questions or concerns.  Johnnye Lana, MSW, LCSW, OSW-C Clinical Social Worker [facility] [phone]

## 2016-12-05 DIAGNOSIS — C023 Malignant neoplasm of anterior two-thirds of tongue, part unspecified: Secondary | ICD-10-CM | POA: Insufficient documentation

## 2016-12-05 DIAGNOSIS — F339 Major depressive disorder, recurrent, unspecified: Secondary | ICD-10-CM | POA: Diagnosis not present

## 2016-12-05 DIAGNOSIS — R1310 Dysphagia, unspecified: Secondary | ICD-10-CM | POA: Diagnosis not present

## 2016-12-05 DIAGNOSIS — D630 Anemia in neoplastic disease: Secondary | ICD-10-CM | POA: Diagnosis not present

## 2016-12-05 DIAGNOSIS — C029 Malignant neoplasm of tongue, unspecified: Secondary | ICD-10-CM | POA: Diagnosis not present

## 2016-12-08 DIAGNOSIS — D630 Anemia in neoplastic disease: Secondary | ICD-10-CM | POA: Diagnosis not present

## 2016-12-08 DIAGNOSIS — R1310 Dysphagia, unspecified: Secondary | ICD-10-CM | POA: Diagnosis not present

## 2016-12-08 DIAGNOSIS — F339 Major depressive disorder, recurrent, unspecified: Secondary | ICD-10-CM | POA: Diagnosis not present

## 2016-12-08 DIAGNOSIS — C029 Malignant neoplasm of tongue, unspecified: Secondary | ICD-10-CM | POA: Diagnosis not present

## 2016-12-09 ENCOUNTER — Other Ambulatory Visit: Payer: Self-pay | Admitting: Internal Medicine

## 2016-12-10 ENCOUNTER — Other Ambulatory Visit (HOSPITAL_COMMUNITY): Payer: Medicare Other

## 2016-12-10 ENCOUNTER — Telehealth: Payer: Self-pay

## 2016-12-10 ENCOUNTER — Ambulatory Visit (HOSPITAL_COMMUNITY): Payer: Medicare Other

## 2016-12-10 NOTE — Telephone Encounter (Signed)
I called and spoke to Kelli Murphy at [place] at the referral center. I let her know that though Dr. Isidore Moos referred her to Hospice she is unable to be her hospice attending physician. She voiced her understanding and will contact Ms. Aggie Moats PCP. She will call if she has any further questions.

## 2016-12-12 DIAGNOSIS — C029 Malignant neoplasm of tongue, unspecified: Secondary | ICD-10-CM | POA: Diagnosis not present

## 2016-12-12 DIAGNOSIS — F339 Major depressive disorder, recurrent, unspecified: Secondary | ICD-10-CM | POA: Diagnosis not present

## 2016-12-12 DIAGNOSIS — D630 Anemia in neoplastic disease: Secondary | ICD-10-CM | POA: Diagnosis not present

## 2016-12-12 DIAGNOSIS — R1310 Dysphagia, unspecified: Secondary | ICD-10-CM | POA: Diagnosis not present

## 2016-12-15 DIAGNOSIS — C029 Malignant neoplasm of tongue, unspecified: Secondary | ICD-10-CM | POA: Diagnosis not present

## 2016-12-15 DIAGNOSIS — R1310 Dysphagia, unspecified: Secondary | ICD-10-CM | POA: Diagnosis not present

## 2016-12-15 DIAGNOSIS — F339 Major depressive disorder, recurrent, unspecified: Secondary | ICD-10-CM | POA: Diagnosis not present

## 2016-12-15 DIAGNOSIS — D630 Anemia in neoplastic disease: Secondary | ICD-10-CM | POA: Diagnosis not present

## 2016-12-18 DIAGNOSIS — D630 Anemia in neoplastic disease: Secondary | ICD-10-CM | POA: Diagnosis not present

## 2016-12-18 DIAGNOSIS — F339 Major depressive disorder, recurrent, unspecified: Secondary | ICD-10-CM | POA: Diagnosis not present

## 2016-12-18 DIAGNOSIS — R1310 Dysphagia, unspecified: Secondary | ICD-10-CM | POA: Diagnosis not present

## 2016-12-18 DIAGNOSIS — C029 Malignant neoplasm of tongue, unspecified: Secondary | ICD-10-CM | POA: Diagnosis not present

## 2016-12-19 DIAGNOSIS — D630 Anemia in neoplastic disease: Secondary | ICD-10-CM | POA: Diagnosis not present

## 2016-12-19 DIAGNOSIS — F339 Major depressive disorder, recurrent, unspecified: Secondary | ICD-10-CM | POA: Diagnosis not present

## 2016-12-19 DIAGNOSIS — R1310 Dysphagia, unspecified: Secondary | ICD-10-CM | POA: Diagnosis not present

## 2016-12-19 DIAGNOSIS — C029 Malignant neoplasm of tongue, unspecified: Secondary | ICD-10-CM | POA: Diagnosis not present

## 2016-12-22 DIAGNOSIS — R1310 Dysphagia, unspecified: Secondary | ICD-10-CM | POA: Diagnosis not present

## 2016-12-22 DIAGNOSIS — D630 Anemia in neoplastic disease: Secondary | ICD-10-CM | POA: Diagnosis not present

## 2016-12-22 DIAGNOSIS — C029 Malignant neoplasm of tongue, unspecified: Secondary | ICD-10-CM | POA: Diagnosis not present

## 2016-12-22 DIAGNOSIS — F339 Major depressive disorder, recurrent, unspecified: Secondary | ICD-10-CM | POA: Diagnosis not present

## 2016-12-23 DIAGNOSIS — D630 Anemia in neoplastic disease: Secondary | ICD-10-CM | POA: Diagnosis not present

## 2016-12-23 DIAGNOSIS — F339 Major depressive disorder, recurrent, unspecified: Secondary | ICD-10-CM | POA: Diagnosis not present

## 2016-12-23 DIAGNOSIS — R1310 Dysphagia, unspecified: Secondary | ICD-10-CM | POA: Diagnosis not present

## 2016-12-23 DIAGNOSIS — C029 Malignant neoplasm of tongue, unspecified: Secondary | ICD-10-CM | POA: Diagnosis not present

## 2016-12-24 DIAGNOSIS — F339 Major depressive disorder, recurrent, unspecified: Secondary | ICD-10-CM | POA: Diagnosis not present

## 2016-12-24 DIAGNOSIS — D630 Anemia in neoplastic disease: Secondary | ICD-10-CM | POA: Diagnosis not present

## 2016-12-24 DIAGNOSIS — C029 Malignant neoplasm of tongue, unspecified: Secondary | ICD-10-CM | POA: Diagnosis not present

## 2016-12-24 DIAGNOSIS — R1310 Dysphagia, unspecified: Secondary | ICD-10-CM | POA: Diagnosis not present

## 2016-12-25 DIAGNOSIS — C029 Malignant neoplasm of tongue, unspecified: Secondary | ICD-10-CM | POA: Diagnosis not present

## 2016-12-25 DIAGNOSIS — F339 Major depressive disorder, recurrent, unspecified: Secondary | ICD-10-CM | POA: Diagnosis not present

## 2016-12-25 DIAGNOSIS — R1310 Dysphagia, unspecified: Secondary | ICD-10-CM | POA: Diagnosis not present

## 2016-12-25 DIAGNOSIS — D630 Anemia in neoplastic disease: Secondary | ICD-10-CM | POA: Diagnosis not present

## 2016-12-26 DIAGNOSIS — R1310 Dysphagia, unspecified: Secondary | ICD-10-CM | POA: Diagnosis not present

## 2016-12-26 DIAGNOSIS — F339 Major depressive disorder, recurrent, unspecified: Secondary | ICD-10-CM | POA: Diagnosis not present

## 2016-12-26 DIAGNOSIS — D630 Anemia in neoplastic disease: Secondary | ICD-10-CM | POA: Diagnosis not present

## 2016-12-26 DIAGNOSIS — C029 Malignant neoplasm of tongue, unspecified: Secondary | ICD-10-CM | POA: Diagnosis not present

## 2016-12-27 DIAGNOSIS — F339 Major depressive disorder, recurrent, unspecified: Secondary | ICD-10-CM | POA: Diagnosis not present

## 2016-12-27 DIAGNOSIS — D630 Anemia in neoplastic disease: Secondary | ICD-10-CM | POA: Diagnosis not present

## 2016-12-27 DIAGNOSIS — R1310 Dysphagia, unspecified: Secondary | ICD-10-CM | POA: Diagnosis not present

## 2016-12-27 DIAGNOSIS — C029 Malignant neoplasm of tongue, unspecified: Secondary | ICD-10-CM | POA: Diagnosis not present

## 2016-12-28 DIAGNOSIS — F339 Major depressive disorder, recurrent, unspecified: Secondary | ICD-10-CM | POA: Diagnosis not present

## 2016-12-28 DIAGNOSIS — R1310 Dysphagia, unspecified: Secondary | ICD-10-CM | POA: Diagnosis not present

## 2016-12-28 DIAGNOSIS — D630 Anemia in neoplastic disease: Secondary | ICD-10-CM | POA: Diagnosis not present

## 2016-12-28 DIAGNOSIS — C029 Malignant neoplasm of tongue, unspecified: Secondary | ICD-10-CM | POA: Diagnosis not present

## 2016-12-29 DIAGNOSIS — D630 Anemia in neoplastic disease: Secondary | ICD-10-CM | POA: Diagnosis not present

## 2016-12-29 DIAGNOSIS — C029 Malignant neoplasm of tongue, unspecified: Secondary | ICD-10-CM | POA: Diagnosis not present

## 2016-12-29 DIAGNOSIS — F339 Major depressive disorder, recurrent, unspecified: Secondary | ICD-10-CM | POA: Diagnosis not present

## 2016-12-29 DIAGNOSIS — R1310 Dysphagia, unspecified: Secondary | ICD-10-CM | POA: Diagnosis not present

## 2016-12-30 DIAGNOSIS — F339 Major depressive disorder, recurrent, unspecified: Secondary | ICD-10-CM | POA: Diagnosis not present

## 2016-12-30 DIAGNOSIS — R1310 Dysphagia, unspecified: Secondary | ICD-10-CM | POA: Diagnosis not present

## 2016-12-30 DIAGNOSIS — C029 Malignant neoplasm of tongue, unspecified: Secondary | ICD-10-CM | POA: Diagnosis not present

## 2016-12-30 DIAGNOSIS — D630 Anemia in neoplastic disease: Secondary | ICD-10-CM | POA: Diagnosis not present

## 2016-12-31 DIAGNOSIS — R1310 Dysphagia, unspecified: Secondary | ICD-10-CM | POA: Diagnosis not present

## 2016-12-31 DIAGNOSIS — F339 Major depressive disorder, recurrent, unspecified: Secondary | ICD-10-CM | POA: Diagnosis not present

## 2016-12-31 DIAGNOSIS — D630 Anemia in neoplastic disease: Secondary | ICD-10-CM | POA: Diagnosis not present

## 2016-12-31 DIAGNOSIS — C029 Malignant neoplasm of tongue, unspecified: Secondary | ICD-10-CM | POA: Diagnosis not present

## 2017-01-01 DIAGNOSIS — D630 Anemia in neoplastic disease: Secondary | ICD-10-CM | POA: Diagnosis not present

## 2017-01-01 DIAGNOSIS — F339 Major depressive disorder, recurrent, unspecified: Secondary | ICD-10-CM | POA: Diagnosis not present

## 2017-01-01 DIAGNOSIS — C029 Malignant neoplasm of tongue, unspecified: Secondary | ICD-10-CM | POA: Diagnosis not present

## 2017-01-01 DIAGNOSIS — R1310 Dysphagia, unspecified: Secondary | ICD-10-CM | POA: Diagnosis not present

## 2017-01-02 DIAGNOSIS — R1310 Dysphagia, unspecified: Secondary | ICD-10-CM | POA: Diagnosis not present

## 2017-01-02 DIAGNOSIS — F339 Major depressive disorder, recurrent, unspecified: Secondary | ICD-10-CM | POA: Diagnosis not present

## 2017-01-02 DIAGNOSIS — D630 Anemia in neoplastic disease: Secondary | ICD-10-CM | POA: Diagnosis not present

## 2017-01-02 DIAGNOSIS — C029 Malignant neoplasm of tongue, unspecified: Secondary | ICD-10-CM | POA: Diagnosis not present

## 2017-01-03 DIAGNOSIS — F339 Major depressive disorder, recurrent, unspecified: Secondary | ICD-10-CM | POA: Diagnosis not present

## 2017-01-03 DIAGNOSIS — D630 Anemia in neoplastic disease: Secondary | ICD-10-CM | POA: Diagnosis not present

## 2017-01-03 DIAGNOSIS — C029 Malignant neoplasm of tongue, unspecified: Secondary | ICD-10-CM | POA: Diagnosis not present

## 2017-01-03 DIAGNOSIS — R1310 Dysphagia, unspecified: Secondary | ICD-10-CM | POA: Diagnosis not present

## 2017-01-04 DIAGNOSIS — C029 Malignant neoplasm of tongue, unspecified: Secondary | ICD-10-CM | POA: Diagnosis not present

## 2017-01-04 DIAGNOSIS — R1310 Dysphagia, unspecified: Secondary | ICD-10-CM | POA: Diagnosis not present

## 2017-01-04 DIAGNOSIS — F339 Major depressive disorder, recurrent, unspecified: Secondary | ICD-10-CM | POA: Diagnosis not present

## 2017-01-04 DIAGNOSIS — D630 Anemia in neoplastic disease: Secondary | ICD-10-CM | POA: Diagnosis not present

## 2017-01-05 DIAGNOSIS — F339 Major depressive disorder, recurrent, unspecified: Secondary | ICD-10-CM | POA: Diagnosis not present

## 2017-01-05 DIAGNOSIS — R1310 Dysphagia, unspecified: Secondary | ICD-10-CM | POA: Diagnosis not present

## 2017-01-05 DIAGNOSIS — D630 Anemia in neoplastic disease: Secondary | ICD-10-CM | POA: Diagnosis not present

## 2017-01-05 DIAGNOSIS — C029 Malignant neoplasm of tongue, unspecified: Secondary | ICD-10-CM | POA: Diagnosis not present

## 2017-01-06 DIAGNOSIS — R1310 Dysphagia, unspecified: Secondary | ICD-10-CM | POA: Diagnosis not present

## 2017-01-06 DIAGNOSIS — F339 Major depressive disorder, recurrent, unspecified: Secondary | ICD-10-CM | POA: Diagnosis not present

## 2017-01-06 DIAGNOSIS — D630 Anemia in neoplastic disease: Secondary | ICD-10-CM | POA: Diagnosis not present

## 2017-01-06 DIAGNOSIS — C029 Malignant neoplasm of tongue, unspecified: Secondary | ICD-10-CM | POA: Diagnosis not present

## 2017-01-07 DIAGNOSIS — R1310 Dysphagia, unspecified: Secondary | ICD-10-CM | POA: Diagnosis not present

## 2017-01-07 DIAGNOSIS — D630 Anemia in neoplastic disease: Secondary | ICD-10-CM | POA: Diagnosis not present

## 2017-01-07 DIAGNOSIS — F339 Major depressive disorder, recurrent, unspecified: Secondary | ICD-10-CM | POA: Diagnosis not present

## 2017-01-07 DIAGNOSIS — C029 Malignant neoplasm of tongue, unspecified: Secondary | ICD-10-CM | POA: Diagnosis not present

## 2017-01-08 DIAGNOSIS — R1310 Dysphagia, unspecified: Secondary | ICD-10-CM | POA: Diagnosis not present

## 2017-01-08 DIAGNOSIS — D630 Anemia in neoplastic disease: Secondary | ICD-10-CM | POA: Diagnosis not present

## 2017-01-08 DIAGNOSIS — F339 Major depressive disorder, recurrent, unspecified: Secondary | ICD-10-CM | POA: Diagnosis not present

## 2017-01-08 DIAGNOSIS — C029 Malignant neoplasm of tongue, unspecified: Secondary | ICD-10-CM | POA: Diagnosis not present

## 2017-01-09 DIAGNOSIS — F339 Major depressive disorder, recurrent, unspecified: Secondary | ICD-10-CM | POA: Diagnosis not present

## 2017-01-09 DIAGNOSIS — D630 Anemia in neoplastic disease: Secondary | ICD-10-CM | POA: Diagnosis not present

## 2017-01-09 DIAGNOSIS — R1310 Dysphagia, unspecified: Secondary | ICD-10-CM | POA: Diagnosis not present

## 2017-01-09 DIAGNOSIS — C029 Malignant neoplasm of tongue, unspecified: Secondary | ICD-10-CM | POA: Diagnosis not present

## 2017-01-10 DIAGNOSIS — R1310 Dysphagia, unspecified: Secondary | ICD-10-CM | POA: Diagnosis not present

## 2017-01-10 DIAGNOSIS — F339 Major depressive disorder, recurrent, unspecified: Secondary | ICD-10-CM | POA: Diagnosis not present

## 2017-01-10 DIAGNOSIS — C029 Malignant neoplasm of tongue, unspecified: Secondary | ICD-10-CM | POA: Diagnosis not present

## 2017-01-10 DIAGNOSIS — D630 Anemia in neoplastic disease: Secondary | ICD-10-CM | POA: Diagnosis not present

## 2017-01-11 DIAGNOSIS — D630 Anemia in neoplastic disease: Secondary | ICD-10-CM | POA: Diagnosis not present

## 2017-01-11 DIAGNOSIS — R1310 Dysphagia, unspecified: Secondary | ICD-10-CM | POA: Diagnosis not present

## 2017-01-11 DIAGNOSIS — F339 Major depressive disorder, recurrent, unspecified: Secondary | ICD-10-CM | POA: Diagnosis not present

## 2017-01-11 DIAGNOSIS — C029 Malignant neoplasm of tongue, unspecified: Secondary | ICD-10-CM | POA: Diagnosis not present

## 2017-02-04 DEATH — deceased
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
